# Patient Record
Sex: Male | Born: 1995 | Race: White | Hispanic: No | Marital: Single | Smoking: Never smoker
Health system: Southern US, Community
[De-identification: ages and names within clinical notes are randomized; demographics above are authoritative.]

## PROBLEM LIST (undated history)

## (undated) HISTORY — PX: TONSILLECTOMY: SUR1361

---
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
## 2020-06-22 ENCOUNTER — Encounter: Payer: Self-pay | Admitting: Emergency Medicine

## 2020-06-22 ENCOUNTER — Other Ambulatory Visit: Payer: Self-pay

## 2020-06-22 ENCOUNTER — Ambulatory Visit (INDEPENDENT_AMBULATORY_CARE_PROVIDER_SITE_OTHER): Payer: BC Managed Care – PPO

## 2020-06-22 ENCOUNTER — Ambulatory Visit
Admission: EM | Admit: 2020-06-22 | Discharge: 2020-06-22 | Disposition: A | Discharge status: Home / Self Care | Payer: BC Managed Care – PPO | Attending: Physician Assistant | Admitting: Physician Assistant

## 2020-06-22 DIAGNOSIS — R509 Fever, unspecified: Secondary | ICD-10-CM | POA: Diagnosis not present

## 2020-06-22 DIAGNOSIS — R059 Cough, unspecified: Secondary | ICD-10-CM

## 2020-06-22 DIAGNOSIS — J209 Acute bronchitis, unspecified: Secondary | ICD-10-CM | POA: Diagnosis not present

## 2020-06-22 MED ORDER — DOXYCYCLINE HYCLATE 100 MG PO CAPS: 100.0000 mg | ORAL_CAPSULE | Freq: Two times a day (BID) | ORAL | 0 refills | Status: DC

## 2020-06-22 MED ORDER — GUAIFENESIN-CODEINE 100-10 MG/5ML PO SOLN: 5.0000 mL | Freq: Every evening | ORAL | 0 refills | Status: DC

## 2020-06-22 MED ORDER — ALBUTEROL SULFATE HFA 108 (90 BASE) MCG/ACT IN AERS: 2.0000 | INHALATION_SPRAY | RESPIRATORY_TRACT | 0 refills | Status: DC | PRN

## 2020-06-22 NOTE — ED Triage Notes (Signed)
Patient c/o cough and fever that started 2.5 weeks ago. He reports nausea and vomiting that started 1.5 weeks ago. He then states he started having diarrhea that started this morning. Patient was tested for COVID 2 weeks ago and this was negative.

## 2020-06-22 NOTE — ED Provider Notes (Signed)
[facility]    CSN: [id number] Arrival date & time: 06/22/20  1107      History   Chief Complaint Chief Complaint  Patient presents with  . Cough  . Fever  . Emesis  . Diarrhea    HPI Gotti Alwin is a 24 y.o. male who presents with hx of fever 2 1/2 weeks ago off and on x 2 /1/2  Weeks if 37-39 C. 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  Then four days ago has had a fever of 39 qd and the last time was last night. Has had N/V x 1.5 weeks, and diarrhea started this am. Has had # 3 watery stools. Has not been out of the country or antibiotics in the past month. His cough had bee non productive initially, but became productine 1 week ago with clear mucous. Has had nose congestion and rhinitis x 2.5 weeks as well.. Denies loss of taste or smell. Has been having coughing fits. Denies wheezing. Denies exposure to [employer] being cleaned.   History reviewed. No pertinent past medical history.  There are no problems to display for this patient.   Past Surgical History:  Procedure Laterality Date  . TONSILLECTOMY       Home Medications    Prior to Admission medications   Medication Sig Start Date End Date Taking? Authorizing Provider  albuterol (VENTOLIN HFA) 108 (90 Base) MCG/ACT inhaler Inhale 2 puffs into the lungs every 4 (four) hours as needed (cough attacks). 06/22/20   Rodriguez-Southworth, Nettie Elm, PA-C  doxycycline (VIBRAMYCIN) 100 MG capsule Take 1 capsule (100 mg total) by mouth 2 (two) times daily. 06/22/20   Rodriguez-Southworth, Nettie Elm, PA-C  guaiFENesin-codeine 100-10 MG/5ML syrup Take 5 mLs by mouth at bedtime. 06/22/20   Rodriguez-Southworth, Nettie Elm, PA-C    Family History Family History  Problem Relation Age of Onset  . Healthy Mother   . Healthy Father     Social History Social History   Tobacco Use  . Smoking status: Never Smoker  . Smokeless tobacco: Never Used  Vaping Use  .  Vaping Use: Never used  Substance Use Topics  . Alcohol use: Not Currently  . Drug use: Never     Allergies   Penicillins   Review of Systems Review of Systems + for cough, nose congention, diarrhea, fever. The rest of 10 point ROS is neg  Physical Exam Triage Vital Signs ED Triage Vitals  Enc Vitals Group     BP 06/22/20 1211 (!) 145/89     Pulse Rate 06/22/20 1211 (!) 107     Resp 06/22/20 1211 18     Temp 06/22/20 1211 98.6 F (37 C)     Temp Source 06/22/20 1211 Oral     SpO2 06/22/20 1211 99 %     Weight --      Height 06/22/20 1209 5\' 8"  (1.727 m)     Head Circumference --      Peak Flow --      Pain Score 06/22/20 1209 0     Pain Loc --      Pain Edu? --      Excl. in GC? --    No data found.  Updated Vital Signs BP (!) 145/89 (BP Location: Left Arm)   Pulse (!) 107   Temp 98.6 F (37 C) (Oral)   Resp 18   Ht 5\' 8"  (1.727 m)  SpO2 99%   BMI 27.37 kg/m   Visual Acuity Right Eye Distance:   Left Eye Distance:   Bilateral Distance:    Right Eye Near:   Left Eye Near:    Bilateral Near:     Physical Exam Physical Exam Vitals signs and nursing note reviewed.  Constitutional:      General: he is not in acute distress. Has episodes of cough when he talks     Appearance: Normal appearance. he is not ill-appearing, toxic-appearing or diaphoretic.  HENT:     Head: Normocephalic.     Right Ear: Tympanic membrane, ear canal and external ear normal.     Left Ear: Tympanic membrane, ear canal and external ear normal.     Nose: Nose normal.     Mouth/Throat:     Mouth: Mucous membranes are moist.  Eyes:     General: No scleral icterus.       Right eye: No discharge.        Left eye: No discharge.     Conjunctiva/sclera: Conjunctivae normal.  Neck:     Musculoskeletal: Neck supple. No neck rigidity.  Cardiovascular:     Rate and Rhythm: Normal rate and regular rhythm.     Heart sounds: No murmur.  Pulmonary:     Effort: Pulmonary effort is  normal.     Breath sounds: Normal breath sounds.  Abdominal:     General: Bowel sounds are normal. There is no distension.     Palpations: Abdomen is soft. There is no mass.     Tenderness: There is no abdominal tenderness. There is no guarding or rebound.     Hernia: No hernia is present.  Musculoskeletal: Normal range of motion.  Lymphadenopathy:     Cervical: No cervical adenopathy.  Skin:    General: Skin is warm and dry.     Coloration: Skin is not jaundiced.     Findings: No rash.  Neurological:     Mental Status: She is alert and oriented to person, place, and time.     Gait: Gait normal.  Psychiatric:        Mood and Affect: Mood normal.        Behavior: Behavior normal.        Thought Content: Thought content normal.        Judgment: Judgment normal.     UC Treatments / Results  Labs (all labs ordered are listed, but only abnormal results are displayed) Labs Reviewed - No data to display  EKG   Radiology DG Chest 2 View  Result Date: 06/22/2020 CLINICAL DATA:  25 year old male with cough and fever for 2.5 weeks. Tested negative for COVID-19 two weeks ago. EXAM: CHEST - 2 VIEW COMPARISON:  None. FINDINGS: Normal lung volumes and mediastinal contours. Visualized tracheal air column is within normal limits. Both lungs appear clear. No pneumothorax or pleural effusion. No osseous abnormality identified. Negative visible bowel gas pattern. IMPRESSION: Negative.  No cardiopulmonary abnormality. Electronically Signed   By: Odessa Fleming M.D.   On: 06/22/2020 12:32    Procedures Procedures (including critical care time)  Medications Ordered in UC Medications - No data to display  Initial Impression / Assessment and Plan / UC Course  I have reviewed the triage vital signs and the nursing notes. I suspect he may have Mycoplasma bronchitis. I placed him on Doxy, Albuterol inhaler to help with bronchospasms, and Robitusin AC qhs for cough.  Pertinent  imaging results that were  available  during my care of the patient were reviewed by me and considered in my medical decision making (see chart for details).   Final Clinical Impressions(s) / UC Diagnoses   Final diagnoses:  Acute bronchitis, unspecified organism     Discharge Instructions     You may have Mycoplasma bronchitis and sinusitis.  If your fever persists in 72 hours, you need to be rechecked.     ED Prescriptions    Medication Sig Dispense Auth. Provider   doxycycline (VIBRAMYCIN) 100 MG capsule Take 1 capsule (100 mg total) by mouth 2 (two) times daily. 20 capsule Rodriguez-Southworth, Providencia Hottenstein, PA-C   guaiFENesin-codeine 100-10 MG/5ML syrup Take 5 mLs by mouth at bedtime. 118 mL Rodriguez-Southworth, Wally Shevchenko, PA-C   albuterol (VENTOLIN HFA) 108 (90 Base) MCG/ACT inhaler Inhale 2 puffs into the lungs every 4 (four) hours as needed (cough attacks). 18 g Rodriguez-Southworth, Nettie Elm, PA-C     PDMP not reviewed this encounter.   Garey Ham, PA-C 06/22/20 1447

## 2020-06-22 NOTE — Discharge Instructions (Addendum)
You may have Mycoplasma bronchitis and sinusitis.  If your fever persists in 72 hours, you need to be rechecked.

## 2020-09-06 ENCOUNTER — Ambulatory Visit
Admission: EM | Admit: 2020-09-06 | Discharge: 2020-09-06 | Disposition: A | Discharge status: Home / Self Care | Payer: BC Managed Care – PPO | Attending: Sports Medicine | Admitting: Sports Medicine

## 2020-09-06 ENCOUNTER — Other Ambulatory Visit: Payer: Self-pay

## 2020-09-06 DIAGNOSIS — J029 Acute pharyngitis, unspecified: Secondary | ICD-10-CM

## 2020-09-06 DIAGNOSIS — R059 Cough, unspecified: Secondary | ICD-10-CM

## 2020-09-06 DIAGNOSIS — H6501 Acute serous otitis media, right ear: Secondary | ICD-10-CM

## 2020-09-06 MED ORDER — AZITHROMYCIN 250 MG PO TABS: 250.0000 mg | ORAL_TABLET | Freq: Every day | ORAL | 0 refills | Status: DC

## 2020-09-06 NOTE — ED Provider Notes (Addendum)
[facility]    CSN: [id number] Arrival date & time: 09/06/20  1032      History   Chief Complaint Chief Complaint  Patient presents with  . Otalgia    HPI Maddox Hlavaty is a 25 y.o. male.   Pleasant 25 year old male who presents for evaluation of right ear pain.  Started last night.  He is also had a little vertigo with his symptoms.  No fever shakes chills.  No nausea vomiting diarrhea.  He has had COVID exposure.  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  Mild sore throat mild cough noted.  No chest pain or shortness of breath.  He has had his COVID-vaccine x2 but no booster.  He has had his influenza vaccine as well.  No fever shakes chills nausea vomiting or diarrhea.  No abdominal pain or urinary symptoms.  He has not had any history of recurring strep throat.  No red flag signs or symptoms offered on history.     History reviewed. No pertinent past medical history.  There are no problems to display for this patient.   Past Surgical History:  Procedure Laterality Date  . TONSILLECTOMY         Home Medications    Prior to Admission medications   Medication Sig Start Date End Date Taking? Authorizing Provider  azithromycin (ZITHROMAX) 250 MG tablet Take 1 tablet (250 mg total) by mouth daily. Take first 2 tablets together, then 1 every day until finished. 09/06/20  Yes Delton See, MD  albuterol (VENTOLIN HFA) 108 (90 Base) MCG/ACT inhaler Inhale 2 puffs into the lungs every 4 (four) hours as needed (cough attacks). 06/22/20   Rodriguez-Southworth, Nettie Elm, PA-C  doxycycline (VIBRAMYCIN) 100 MG capsule Take 1 capsule (100 mg total) by mouth 2 (two) times daily. 06/22/20   Rodriguez-Southworth, Nettie Elm, PA-C  guaiFENesin-codeine 100-10 MG/5ML syrup Take 5 mLs by mouth at bedtime. 06/22/20   Rodriguez-Southworth, Nettie Elm, PA-C    Family History Family History  Problem Relation Age of Onset  . Healthy Mother   . Healthy Father     Social  History Social History   Tobacco Use  . Smoking status: Never Smoker  . Smokeless tobacco: Never Used  Vaping Use  . Vaping Use: Never used  Substance Use Topics  . Alcohol use: Not Currently  . Drug use: Never     Allergies   Penicillins   Review of Systems Review of Systems  Constitutional: Negative for activity change, appetite change, chills, fatigue and fever.  HENT: Positive for ear pain and sore throat. Negative for congestion, ear discharge, postnasal drip, rhinorrhea, sinus pressure and sinus pain.   Respiratory: Positive for cough. Negative for apnea, chest tightness, shortness of breath, wheezing and stridor.   Cardiovascular: Negative for chest pain and palpitations.  Gastrointestinal: Negative for abdominal pain.  Genitourinary: Negative for dysuria.  Musculoskeletal: Negative for myalgias.  Skin: Negative for color change, pallor, rash and wound.  Neurological: Positive for dizziness. Negative for tremors, syncope, weakness, light-headedness, numbness and headaches.       Mild vertigo  All other systems reviewed and are negative.    Physical Exam Triage Vital Signs ED Triage Vitals  Enc Vitals Group     BP 09/06/20 1138 138/86     Pulse Rate 09/06/20 1138 60     Resp 09/06/20 1138 19     Temp 09/06/20 1138 98.5 F (36.9 C)     Temp src --  SpO2 09/06/20 1138 100 %     Weight --      Height --      Head Circumference --      Peak Flow --      Pain Score 09/06/20 1137 0     Pain Loc --      Pain Edu? --      Excl. in GC? --    No data found.  Updated Vital Signs BP 138/86   Pulse 60   Temp 98.5 F (36.9 C)   Resp 19   SpO2 100%   Visual Acuity Right Eye Distance:   Left Eye Distance:   Bilateral Distance:    Right Eye Near:   Left Eye Near:    Bilateral Near:     Physical Exam Vitals and nursing note reviewed.  Constitutional:      General: He is not in acute distress.    Appearance: Normal appearance. He is not  ill-appearing, toxic-appearing or diaphoretic.  HENT:     Head: Normocephalic and atraumatic.     Right Ear: Tympanic membrane is erythematous and bulging. Tympanic membrane is not perforated or retracted.     Left Ear: Tympanic membrane normal.     Nose: Nose normal. No congestion or rhinorrhea.     Mouth/Throat:     Mouth: Mucous membranes are dry.     Pharynx: Posterior oropharyngeal erythema present. No oropharyngeal exudate.  Eyes:     Extraocular Movements: Extraocular movements intact.     Conjunctiva/sclera: Conjunctivae normal.     Pupils: Pupils are equal, round, and reactive to light.  Cardiovascular:     Rate and Rhythm: Normal rate and regular rhythm.     Pulses: Normal pulses.     Heart sounds: Normal heart sounds. No murmur heard. No friction rub. No gallop.   Pulmonary:     Effort: Pulmonary effort is normal. No respiratory distress.     Breath sounds: Normal breath sounds. No stridor. No wheezing, rhonchi or rales.  Musculoskeletal:     Cervical back: Normal range of motion and neck supple. No rigidity or tenderness.  Lymphadenopathy:     Cervical: Cervical adenopathy present.  Skin:    General: Skin is warm and dry.     Capillary Refill: Capillary refill takes less than 2 seconds.  Neurological:     General: No focal deficit present.     Mental Status: He is alert and oriented to person, place, and time.      UC Treatments / Results  Labs (all labs ordered are listed, but only abnormal results are displayed) Labs Reviewed - No data to display  EKG   Radiology No results found.  Procedures Procedures (including critical care time)  Medications Ordered in UC Medications - No data to display  Initial Impression / Assessment and Plan / UC Course  I have reviewed the triage vital signs and the nursing notes.  Pertinent labs & imaging results that were available during my care of the patient were reviewed by me and considered in my medical decision  making (see chart for details).  Clinical impression: 25 year old with 2-day history of right ear pain and some mild vertigo.  Exam is consistent with a early otitis media.  Patient has a penicillin allergy and will treat accordingly.  Remainder exam is reassuring as is his vitals.  Treatment plan: 1.  The findings and treatment plan were discussed in detail with the patient.  Patient was in agreement. 2.  Given his penicillin allergy we will treat him with azithromycin.  This was sent to his pharmacy. 3.  He has had COVID exposure however his exam is reassuring.  I did not feel compelled to test him for COVID today.  Certainly if his symptoms were to worsen then he should get a COVID test.  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 4.  Just supportive care, plenty of fluids, plenty of rest.  Tylenol or Motrin for fever or discomfort. 5.  He does not work until tomorrow and I do not feel the need to keep him out of work.  No work note was provided. 6.  Educational handout on otitis media was provided. 7.  Follow-up here as needed.    Final Clinical Impressions(s) / UC Diagnoses   Final diagnoses:  Non-recurrent acute serous otitis media of right ear  Cough  Acute sore throat     Discharge Instructions     Given his penicillin allergy we will treat him with azithromycin.  This was sent to his pharmacy. He has had COVID exposure however his exam is reassuring.  I did not feel compelled to test him for COVID today.  Certainly if his symptoms were to worsen then he should get a COVID test.  He works in the emergency room in [place] city as a Curator so he can obtain the test there. Just supportive care, plenty of fluids, plenty of rest.  Tylenol or Motrin for fever or discomfort. 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  No work note was provided. Educational handout on otitis media was  provided. Follow-up here as needed.    ED Prescriptions    Medication Sig Dispense Auth. Provider   azithromycin (ZITHROMAX) 250 MG tablet Take 1 tablet (250 mg total) by mouth daily. Take first 2 tablets together, then 1 every day until finished. 6 tablet Delton See, MD     PDMP not reviewed this encounter.   Delton See, MD 09/06/20 1257    Delton See, MD 09/06/20 1327

## 2020-09-06 NOTE — ED Triage Notes (Signed)
Pt reports he is having heaviness in his right ear. Reports vertigo that started this morning that continues. Patient also had a bloody nose this morning. Reports the nose bleed did not last long. Pt denies other symptoms.

## 2020-09-06 NOTE — Discharge Instructions (Addendum)
Given his penicillin allergy we will treat him with azithromycin.  This was sent to his pharmacy. He has had COVID exposure however his exam is reassuring.  I did not feel compelled to test him for COVID today.  Certainly if his symptoms were to worsen then he should get a COVID test.  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 Just supportive care, plenty of fluids, plenty of rest.  Tylenol or Motrin for fever or discomfort. 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  No work note was provided. Educational handout on otitis media was provided. Follow-up here as needed.

## 2021-07-20 ENCOUNTER — Other Ambulatory Visit: Payer: Self-pay

## 2021-07-20 ENCOUNTER — Ambulatory Visit
Admission: EM | Admit: 2021-07-20 | Discharge: 2021-07-20 | Disposition: A | Discharge status: Home / Self Care | Payer: BC Managed Care – PPO | Attending: Physician Assistant | Admitting: Physician Assistant

## 2021-07-20 DIAGNOSIS — R229 Localized swelling, mass and lump, unspecified: Secondary | ICD-10-CM | POA: Diagnosis not present

## 2021-07-20 MED ORDER — CEPHALEXIN 500 MG PO CAPS: 500.0000 mg | ORAL_CAPSULE | Freq: Two times a day (BID) | ORAL | 0 refills | Status: AC

## 2021-07-20 NOTE — Discharge Instructions (Addendum)
-  Keflex: 1 tablet twice a day for 10 days I have no idea noticed tomorrow if COVID got and from 8-12 Boykin Nearing from 8-4 right Hegler from 8-4 but that he is but nobody also for 12 Sunday has 1 niece Venancio Poisson 84 and Christiane Ha Cuthriell from 10-2 nt after 3-4 days of antibiotics, return to clinic for reevaluation.

## 2021-07-20 NOTE — ED Provider Notes (Signed)
[facility]    CSN: [id number] Arrival date & time: 07/20/21  1823      History   Chief Complaint Chief Complaint  Patient presents with   Tailbone Pain    HPI Clayton Boyle is a 25 y.o. male.   Patient is a 25 year old male who presents with chief complaint of pain to the top of the gluteal cleft that started last night.  Patient denies any falls or trauma to the area.  Patient denies any similar type pain in the past.  Patient denies any known insect bites or anything like that he has not done any treatment for it.  Patient reports allergies to penicillins.   History reviewed. No pertinent past medical history.  There are no problems to display for this patient.   Past Surgical History:  Procedure Laterality Date   TONSILLECTOMY         Home Medications    Prior to Admission medications   Medication Sig Start Date End Date Taking? Authorizing Provider  cephALEXin (KEFLEX) 500 MG capsule Take 1 capsule (500 mg total) by mouth 2 (two) times daily for 10 days. 07/20/21 07/30/21 Yes Candis Schatz, PA-C    Family History Family History  Problem Relation Age of Onset   Healthy Mother    Healthy Father     Social History Social History   Tobacco Use   Smoking status: Never   Smokeless tobacco: Never  Vaping Use   Vaping Use: Never used  Substance Use Topics   Alcohol use: Not Currently   Drug use: Never     Allergies   Penicillins   Review of Systems Review of Systems as noted above in HPI.  Other systems reviewed and found to be negative   Physical Exam Triage Vital Signs ED Triage Vitals  Enc Vitals Group     BP 07/20/21 1853 127/77     Pulse Rate 07/20/21 1853 74     Resp 07/20/21 1853 18     Temp 07/20/21 1853 98.8 F (37.1 C)     Temp Source 07/20/21 1853 Oral     SpO2 07/20/21 1853 98 %     Weight 07/20/21 1852 210 lb (95.3 kg)     Height 07/20/21 1852 5\' 8"  (1.727 m)     Head Circumference --      Peak Flow --       Pain Score 07/20/21 1852 3     Pain Loc --      Pain Edu? --      Excl. in GC? --    No data found.  Updated Vital Signs BP 127/77 (BP Location: Left Arm)   Pulse 74   Temp 98.8 F (37.1 C) (Oral)   Resp 18   Ht 5\' 8"  (1.727 m)   Wt 210 lb (95.3 kg)   SpO2 98%   BMI 31.93 kg/m    Physical Exam Constitutional:      Appearance: Normal appearance.  HENT:     Head: Normocephalic and atraumatic.  Musculoskeletal:        General: Normal range of motion.     Cervical back: Normal range of motion.  Skin:      Neurological:     Mental Status: He is alert.     UC Treatments / Results  Labs (all labs ordered are listed, but only abnormal results are displayed) Labs Reviewed - No data to display  EKG   Radiology No results found.  Procedures Procedures (  including critical care time)  Medications Ordered in UC Medications - No data to display  Initial Impression / Assessment and Plan / UC Course  I have reviewed the triage vital signs and the nursing notes.  Pertinent labs & imaging results that were available during my care of the patient were reviewed by me and considered in my medical decision making (see chart for details).    Patient with painful nodule at the top of the gluteal cleft.  Possible early abscess versus cyst.  No erythema, no induration, no fever.  It is tender to palpation.  We will go ahead and put him on an antibiotic advised him if no improvement in 3 to 4 days to return to clinic for reevaluation. Final Clinical Impressions(s) / UC Diagnoses   Final diagnoses:  Skin nodule     Discharge Instructions      -Keflex: 1 tablet twice a day for 10 days -Can use ibuprofen and Tylenol for pain -Can use warm bath with Epsom salt for pain relief as well -If no improvement after 3-4 days of antibiotics, return to clinic for reevaluation.     ED Prescriptions     Medication Sig Dispense Auth. Provider   cephALEXin (KEFLEX) 500 MG capsule  Take 1 capsule (500 mg total) by mouth 2 (two) times daily for 10 days. 20 capsule Candis Schatz, PA-C      PDMP not reviewed this encounter.   Candis Schatz, PA-C 07/20/21 1954

## 2021-07-20 NOTE — ED Triage Notes (Signed)
Pt here with C/O tailbone pain, no known fall or anything, thinks it may be a cyst, started last night.

## 2022-02-28 IMAGING — CR DG CHEST 2V
2 series · 2 of 2 positions shown · non-contrast
Comparison: None.

CLINICAL DATA: 23-year-old male with cough and fever for 2.5 weeks.
Tested negative for 1CVHO-BU two weeks ago.

EXAM:
CHEST - 2 VIEW

[chest pa]
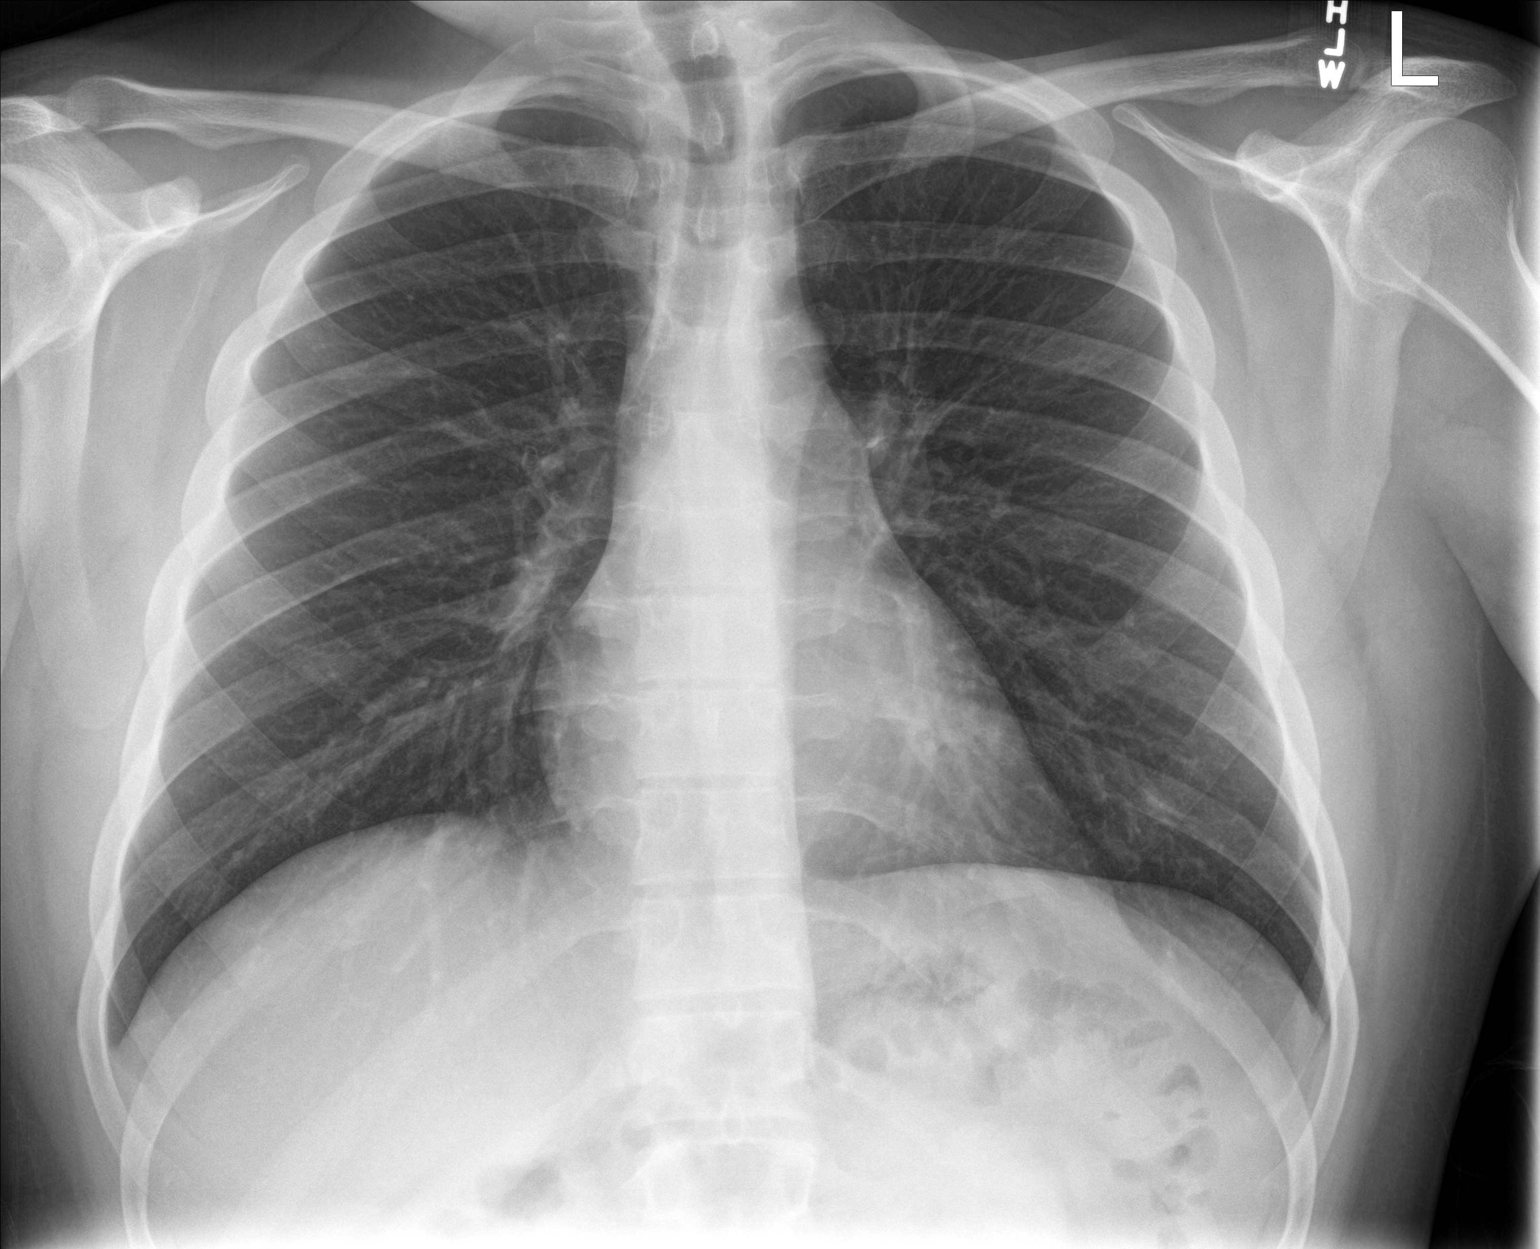

[chest lat]
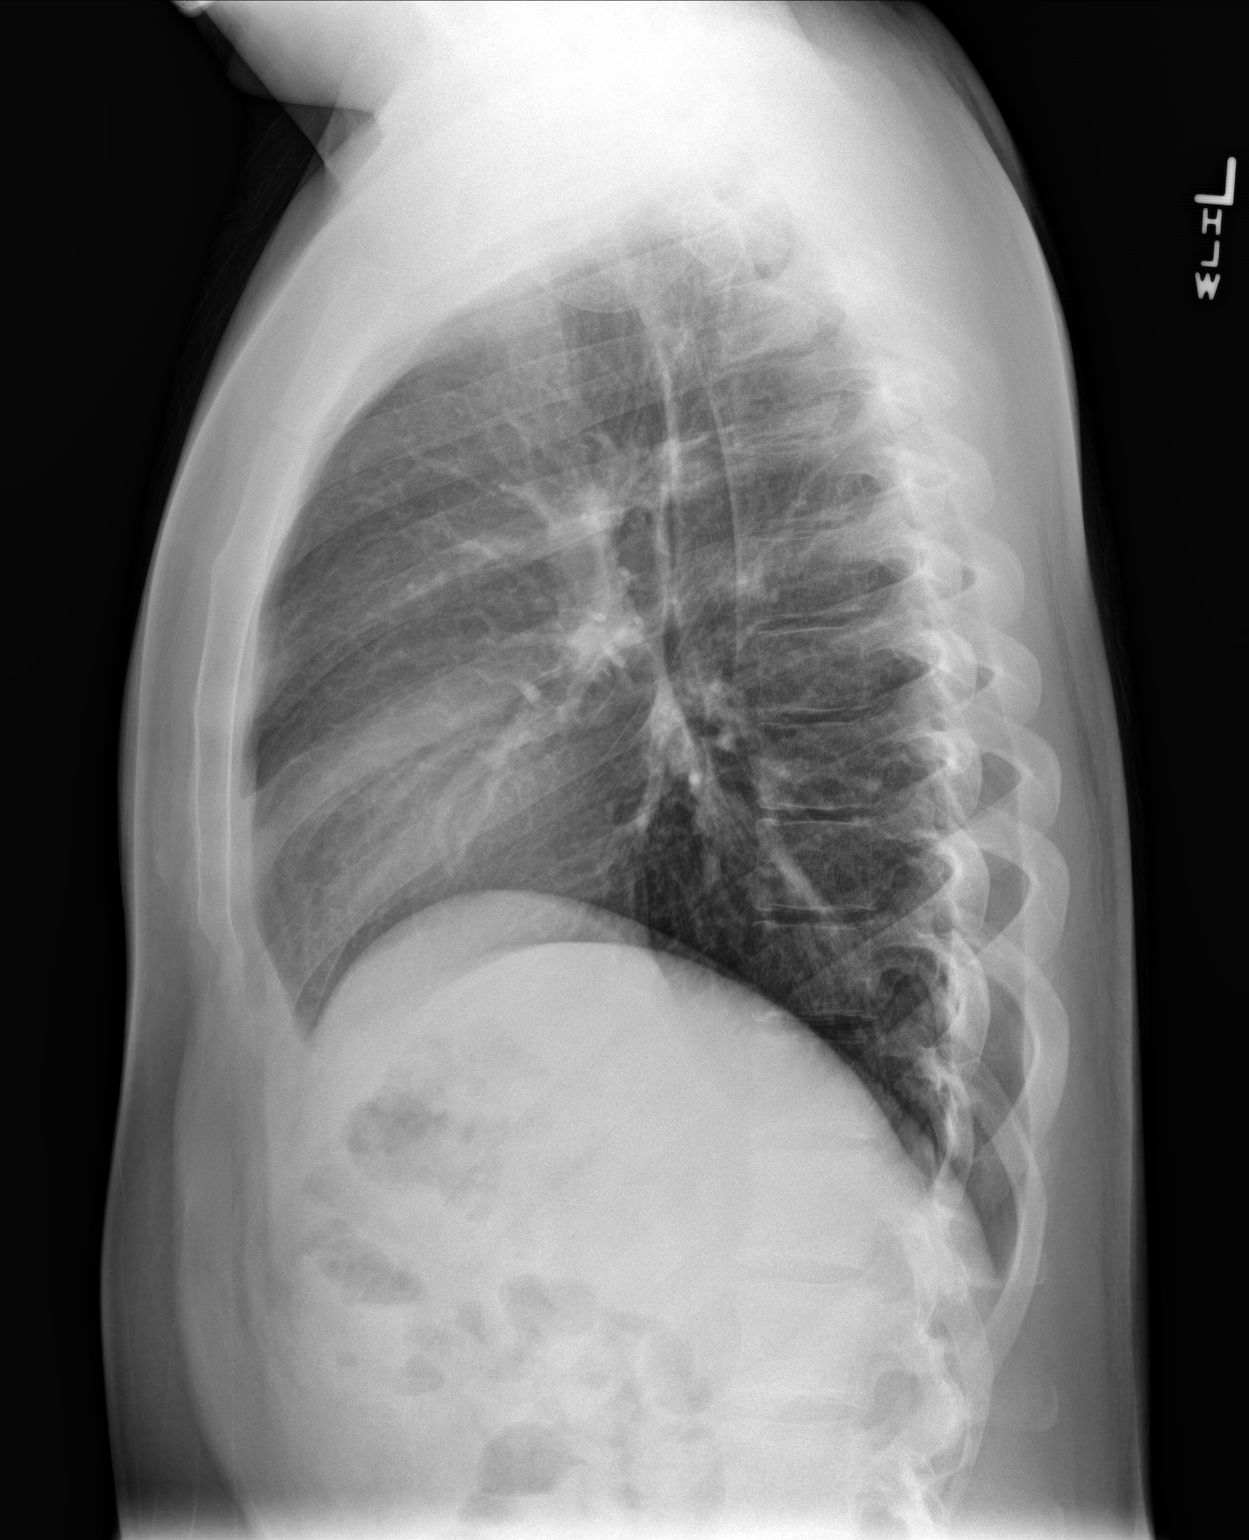

[2 of 2 positions shown; findings below may reference images not displayed]

FINDINGS: Normal lung volumes and mediastinal contours. Visualized tracheal
air column is within normal limits. Both lungs appear clear. No
pneumothorax or pleural effusion.

No osseous abnormality identified. Negative visible bowel gas
pattern.
IMPRESSION: Negative.  No cardiopulmonary abnormality.

## 2022-12-03 ENCOUNTER — Ambulatory Visit
Admission: EM | Admit: 2022-12-03 | Discharge: 2022-12-03 | Disposition: A | Discharge status: Home / Self Care | Payer: Commercial Managed Care - PPO | Attending: Physician Assistant | Admitting: Physician Assistant

## 2022-12-03 DIAGNOSIS — K611 Rectal abscess: Secondary | ICD-10-CM | POA: Diagnosis not present

## 2022-12-03 MED ORDER — DOXYCYCLINE HYCLATE 100 MG PO CAPS: 100.0000 mg | ORAL_CAPSULE | Freq: Two times a day (BID) | ORAL | 0 refills | Status: DC

## 2022-12-03 NOTE — ED Provider Notes (Signed)
[facility]    CSN: [id number] Arrival date & time: 12/03/22  0806      History   Chief Complaint Chief Complaint  Patient presents with   Cyst    HPI Clayton Boyle is a 27 y.o. male.   27 year old male presents with rectal pain.  Patient indicates for the past year he has been having intermittent episodes of perirectal abscess that has been occurring every couple months.  Patient indicates over the past several days he has been having rectal pain and discomfort where he usually has the abscesses occur.  He indicates that he has to use a belt which tends to aggravate the area and he also does quite a bit of sitting in his job this also aggravates the area.  He indicates that he is having progressive pain and discomfort when he tries to sit.  He indicates that the area is tender, he believes that it is swollen.  He indicates usually it does not drain he is usually given an antibiotic and it tends to resolve.  Patient expresses the desire to go to a surgeon to have the area evaluated and possibly resolved.  He is without fever or chills.  He has been taking OTC ibuprofen with minimal relief.     History reviewed. No pertinent past medical history.  There are no problems to display for this patient.   Past Surgical History:  Procedure Laterality Date   TONSILLECTOMY         Home Medications    Prior to Admission medications   Medication Sig Start Date End Date Taking? Authorizing Provider  doxycycline (VIBRAMYCIN) 100 MG capsule Take 1 capsule (100 mg total) by mouth 2 (two) times daily. 12/03/22  Yes Ellsworth Lennox, PA-C    Family History Family History  Problem Relation Age of Onset   Healthy Mother    Healthy Father     Social History Social History   Tobacco Use   Smoking status: Never   Smokeless tobacco: Never  Vaping Use   Vaping Use: Never used  Substance Use Topics   Alcohol use: Not Currently   Drug use: Never     Allergies    Penicillins   Review of Systems Review of Systems  Gastrointestinal:  Positive for rectal pain (perirectal abscess).     Physical Exam Triage Vital Signs ED Triage Vitals  Enc Vitals Group     BP 12/03/22 0822 131/75     Pulse Rate 12/03/22 0822 98     Resp 12/03/22 0822 17     Temp 12/03/22 0822 98.8 F (37.1 C)     Temp Source 12/03/22 0822 Oral     SpO2 12/03/22 0822 97 %     Weight --      Height --      Head Circumference --      Peak Flow --      Pain Score 12/03/22 0820 0     Pain Loc --      Pain Edu? --      Excl. in GC? --    No data found.  Updated Vital Signs BP 131/75 (BP Location: Right Arm)   Pulse 98   Temp 98.8 F (37.1 C) (Oral)   Resp 17   SpO2 97%   Visual Acuity Right Eye Distance:   Left Eye Distance:   Bilateral Distance:    Right Eye Near:   Left Eye Near:    Bilateral Near:  Physical Exam Constitutional:      Appearance: Normal appearance.  Genitourinary:      Comments: Rectum: There is a 2 x 2-1/2 cm perirectal abscess that is present, material is solid without fluctuance, mild redness is present, there is pain with palpation, no drainage present. Neurological:     Mental Status: He is alert.      UC Treatments / Results  Labs (all labs ordered are listed, but only abnormal results are displayed) Labs Reviewed - No data to display  EKG   Radiology No results found.  Procedures Procedures (including critical care time)  Medications Ordered in UC Medications - No data to display  Initial Impression / Assessment and Plan / UC Course  I have reviewed the triage vital signs and the nursing notes.  Pertinent labs & imaging results that were available during my care of the patient were reviewed by me and considered in my medical decision making (see chart for details).    Plan: The diagnosis will be treated with the following: 1.  Perirectal abscess: A.  Doxycycline 100 mg every 12 hours to treat  infection. B.  Ibuprofen 600 mg every 6 hours with food to help decrease pain and discomfort. C.  Epsom salt soaks, 1 tablespoon lukewarm water, soak 15 minutes, 3-4 times throughout the evening to help reduce pain and discomfort. 2.  Internal referral to [facility] may been for evaluation and treatment modification of the abscess. 3.  Advised follow-up PCP return to urgent care as needed. Final Clinical Impressions(s) / UC Diagnoses   Final diagnoses:  Perirectal abscess     Discharge Instructions      Advised take ibuprofen 600 mg every 6 hours with food on a regular basis to decrease pain and discomfort. Advised to use Epsom salt soaks, 1 tablespoon Epsom salts, lukewarm water, soak for 10 to 15 minutes, 3-4 times throughout the day to help reduce pain and swelling. Advised take the doxycycline 100 mg every 12 hours on a regular basis to help decrease infection and discomfort.  [facility] [place] [facility], their office should be calling you within the next 48-72 hours to arrange an appointment to be seen and evaluated for the abscess.  Advised follow-up PCP return to urgent care as needed.    ED Prescriptions     Medication Sig Dispense Auth. Provider   doxycycline (VIBRAMYCIN) 100 MG capsule Take 1 capsule (100 mg total) by mouth 2 (two) times daily. 20 capsule Ellsworth Lennox, PA-C      PDMP not reviewed this encounter.   Ellsworth Lennox, PA-C 12/03/22 [phone]

## 2022-12-03 NOTE — ED Triage Notes (Signed)
Pt c/o cyst on tail bone pt states it has been recurring over the past year and would like it to be looked at.   Pt has previously used antibiotics for it.

## 2022-12-03 NOTE — Discharge Instructions (Addendum)
Advised take ibuprofen 600 mg every 6 hours with food on a regular basis to decrease pain and discomfort. Advised to use Epsom salt soaks, 1 tablespoon Epsom salts, lukewarm water, soak for 10 to 15 minutes, 3-4 times throughout the day to help reduce pain and swelling. Advised take the doxycycline 100 mg every 12 hours on a regular basis to help decrease infection and discomfort.  [facility] been [facility] [place] [facility], their office should be calling you within the next 48-72 hours to arrange an appointment to be seen and evaluated for the abscess.  Advised follow-up PCP return to urgent care as needed.

## 2022-12-04 ENCOUNTER — Encounter: Payer: Self-pay | Admitting: Surgery

## 2022-12-04 ENCOUNTER — Ambulatory Visit (INDEPENDENT_AMBULATORY_CARE_PROVIDER_SITE_OTHER): Payer: Commercial Managed Care - PPO | Admitting: Surgery

## 2022-12-04 VITALS — BP 137/88 | HR 94 | Temp 98.7°F | Ht 68.0 in | Wt 220.4 lb

## 2022-12-04 DIAGNOSIS — L0591 Pilonidal cyst without abscess: Secondary | ICD-10-CM

## 2022-12-04 NOTE — Progress Notes (Signed)
12/04/2022  Reason for Visit:  Infected pilonidal cyst  History of Present Illness: Clayton Boyle is a 27 y.o. male presenting for evaluation of an infected pilonidal cyst.  He reports he has had this area in the upper portion of the gluteal cleft that has been flaring back and forth over the past 2 years.  The last episode was a few months ago but also lasted longer than the priors.  Most recently, he presented to Urgent Care yesterday with complaints of a few days of worsening pain and swelling in the same area.  Denies any drainage.  Typically on prior episodes he's received antibiotic and has helped.  On the last episode he got Ancef and this appears to have not helped as much as the soreness lasted much longer.  Inetta Fermo he was started on Doxycycline and he started the course last night.  Denies any fevers or chills.  He has not had any procedures in the area and never required an I&D in the past.  Past Medical History: --Pilonidal cyst   Past Surgical History: Past Surgical History:  Procedure Laterality Date   TONSILLECTOMY      Home Medications: Prior to Admission medications   Medication Sig Start Date End Date Taking? Authorizing Provider  doxycycline (VIBRAMYCIN) 100 MG capsule Take 1 capsule (100 mg total) by mouth 2 (two) times daily. 12/03/22  Yes Ellsworth Lennox, PA-C    Allergies: Allergies  Allergen Reactions   Penicillins Hives    Social History:  reports that he has never smoked. He has never used smokeless tobacco. He reports that he does not currently use alcohol. He reports that he does not use drugs.   Family History: Family History  Problem Relation Age of Onset   Healthy Mother    Healthy Father     Review of Systems: Review of Systems  Constitutional:  Negative for chills and fever.  Respiratory:  Negative for shortness of breath.   Cardiovascular:  Negative for chest pain.  Gastrointestinal:  Negative for abdominal pain, nausea and vomiting.  Skin:         Swelling/redness at top of gluteal cleft    Physical Exam BP 137/88   Pulse 94   Temp 98.7 F (37.1 C) (Oral)   Ht  (1.727 m)   Wt 220 lb 6.4 oz (100 kg)   SpO2 99%   BMI 33.51 kg/m  CONSTITUTIONAL: No acute distress HEENT:  Normocephalic, atraumatic, extraocular motion intact. RESPIRATORY:  Normal respiratory effort without pathologic use of accessory muscles. CARDIOVASCULAR: Regular rhythm and rate. MUSCULOSKELETAL:  Normal muscle strength and tone in all four extremities.  No peripheral edema or cyanosis. SKIN: The superior portion of the gluteal cleft has an area of inflammation/induration with erythema measuring about 3 x 2.5 cm, without fluctuance.  Inferior to this, he has several pilonidal sinus/pits.  No active draiange.  NEUROLOGIC:  Motor and sensation is grossly normal.  Cranial nerves are grossly intact. PSYCH:  Alert and oriented to person, place and time. Affect is normal.  Laboratory Analysis: No results found for this or any previous visit (from the past 24 hour(s)).  Imaging: No results found.  Assessment and Plan: This is a 27 y.o. male with an infected pilonidal cyst  --Discussed with the patient that he has an infected pilonidal cyst.  Currently the area has induration and erythema but no real fluctuance.  He just started Doxycycline.  I think for now it is ok to continue the new antibiotic  course.  If however the area is not improving in two days (4/19), I instructed him to call us back so we can see him and potentially do an I&D.  Discussed with him that down the line we would recommend excision of the pilonidal cyst to help prevent further episodes.  However, this episodes needs to heal and infection subside prior to any formal excision. --Patient understands this plan and all of his questions have been answered.  I spent 30 minutes dedicated to the care of this patient on the date of this encounter to include pre-visit review of records,  face-to-face time with the patient discussing diagnosis and management, and any post-visit coordination of care.   Howie Ill, MD [facility]

## 2022-12-04 NOTE — Patient Instructions (Addendum)
Complete the Doxycycline and use Ibuprofen and Tylenol for pain.  If you have any concerns or questions, please feel free to call our office. See follow up appointment below.   If its not better by Friday, please call us.  Pilonidal Cyst  A pilonidal cyst is a fluid-filled sac that forms under the skin near the tailbone, at the top of the crease of the buttocks (pilonidal area). Cysts that are small and not infected may not cause any problems. Cysts that become irritated or infected may grow and fill with pus. An infected cyst is called an abscess. A pilonidal abscess may cause pain and swelling. It may need to be drained or removed. What are the causes? The cause of this condition is not always known. In some cases, it may be caused by a hair that grows into your skin (ingrown hair). What increases the risk? You are more likely to develop this condition if: You are male. You have lots of hair near the crease of the buttocks. You are overweight. You have a dimple near the crease of the buttocks. You wear tight clothing. You do not bathe or shower often. You sit for long periods of time. What are the signs or symptoms? Symptoms of this condition may include pain, swelling, redness, and warmth in the pilonidal area. You may also be able to feel a lump near your tailbone if your cyst is big. If your cyst becomes infected, symptoms may include: Pus or fluid drainage. Fever. Pain, swelling, and redness getting worse. The lump getting bigger. How is this diagnosed? This condition may be diagnosed based on: Your symptoms and medical history. A physical exam. A blood test to check for infection. A test of a pus sample. How is this treated? You may not need any treatment if your cyst does not cause symptoms. If your cyst bothers you or is infected, you may need a procedure to drain or remove the cyst. Depending on the size, location, and severity of your cyst, your health care provider  may: Make an incision in the cyst and drain it (incision anddrainage). Open and drain the cyst, and then stitch the wound so that it stays open while it heals (marsupialization). You will be given instructions about how to care for your open wound while it heals. Remove all or part of the cyst, and then close the wound (cyst removal). You may need to take antibiotics before your procedure. Follow these instructions at home: Medicines Take over-the-counter and prescription medicines only as told by your health care provider. If you were prescribed antibiotics, take them as told by your health care provider. Do not stop taking the antibiotic even if you start to feel better. General instructions Keep the area around the cyst clean and dry. If there is fluid or pus draining from your cyst: Cover the area with a clean bandage (dressing). Wash the area gently with soap and water. Pat the area dry with a clean towel. Do not rub the area because that may cause bleeding. Remove hair from the area around the cyst only if your health care provider tells you to. Do not wear tight pants or sit in one position for long periods at a time. Contact a health care provider if: You have new redness, swelling, or pain. You have a fever. You have severe pain. Summary A pilonidal cyst is a fluid-filled sac that forms under the skin near the tailbone, at the top of the crease of the buttocks (  pilonidal area). Cysts that become irritated or infected may grow and fill with pus. An infected cyst is called an abscess. The cause of this condition is not always known. In some cases, it may be caused by a hair that grows into your skin (ingrown hair). You may not need any treatment if your cyst does not cause symptoms. If your cyst bothers you or is infected, you may need a procedure to drain or remove the cyst. This information is not intended to replace advice given to you by your health care provider. Make sure you  discuss any questions you have with your health care provider. Document Revised: 10/31/2021 Document Reviewed: 10/31/2021 Elsevier Patient Education  2023 [employer].

## 2022-12-09 ENCOUNTER — Encounter: Payer: Self-pay | Admitting: Surgery

## 2022-12-09 ENCOUNTER — Ambulatory Visit (INDEPENDENT_AMBULATORY_CARE_PROVIDER_SITE_OTHER): Payer: Commercial Managed Care - PPO | Admitting: Surgery

## 2022-12-09 VITALS — BP 119/83 | HR 91 | Temp 98.7°F | Ht 69.0 in | Wt 221.6 lb

## 2022-12-09 DIAGNOSIS — L0591 Pilonidal cyst without abscess: Secondary | ICD-10-CM

## 2022-12-09 MED ORDER — OXYCODONE HCL 5 MG PO TABS: 5.0000 mg | ORAL_TABLET | Freq: Four times a day (QID) | ORAL | 0 refills | Status: DC | PRN

## 2022-12-09 MED ORDER — DOXYCYCLINE HYCLATE 100 MG PO CAPS: 100.0000 mg | ORAL_CAPSULE | Freq: Two times a day (BID) | ORAL | 0 refills | Status: AC

## 2022-12-09 NOTE — Patient Instructions (Signed)
If you have any concerns or questions, please feel free to call our office. See follow up appointment below.   Incision and Drainage, Care After After incision and drainage, it is common to have: Pain or discomfort around the incision site. Blood, fluid, or pus (drainage) from the incision. Redness and firm skin around the incision site. Follow these instructions at home: Medicines Take over-the-counter and prescription medicines only as told by your health care provider. If you were prescribed antibiotics, take them as told by your provider. Do not stop using the antibiotic even if you start to feel better. Do not apply creams, ointments, or liquids unless you have been told to by your provider. Wound care Follow instructions from your provider about how to take care of your wound. Make sure you: Wash your hands with soap and water for at least 20 seconds before and after you change your bandage (dressing). If soap and water are not available, use hand sanitizer. Change your dressing and any packing as told by your provider. If the dressing is dry or stuck when you try to remove it, moisten or wet it with saline or water. This will help you remove it without harming your skin or tissues. If your wound is packed, leave it in place until your provider tells you to remove it. To remove it, moisten or wet the packing with saline or water. Leave stitches (sutures), skin glue, or tape strips in place. These skin closures may need to stay in place for 2 weeks or longer. If tape strip edges start to loosen and curl up, you may trim the loose edges. Do not remove tape strips completely unless your provider tells you to do that. Check your wound every day for signs of infection. Check for: More redness, swelling, or pain. More fluid or blood. Warmth. Pus or a bad smell. If you were sent home with a drain tube in place, follow instructions from your provider about: How to empty it. How to care for it  at home. Be careful when you get rid of used dressings, wound packing, or drainage. Activity Rest the affected area. Return to your normal activities as told by your provider. Ask your provider what activities are safe for you. General instructions Do not use any products that contain nicotine or tobacco. These products include cigarettes, chewing tobacco, and vaping devices, such as e-cigarettes. These can delay incision healing after surgery. If you need help quitting, ask your provider. Do not take baths, swim, or use a hot tub until your provider approves. Ask your provider if you may take showers. You may only be allowed to take sponge baths. The incision will keep draining. It is normal to have some clear or slightly bloody drainage. The amount of drainage should go down each day. Keep all follow-up visits. Your provider will need to make sure that your incision is healing well and that there are no problems. Your health care provider may give you more instructions. Make sure you know what you can and cannot do Contact a health care provider if: Your cyst or abscess comes back. You have any signs of infection. You notice red streaks that spread away from the incision site. You have a fever or chills. Get help right away if: You have severe pain or bleeding. You become short of breath. You have chest pain. You have signs of a severe infection. You may notice changes in your incision area, such as: Swelling that makes the skin feel  hard. Numbness or tingling. Sudden increase in redness. Your skin color may change from red to purple, and then to dark spots. Blisters, ulcers, or splitting of the skin. These symptoms may be an emergency. Get help right away. Call 911. Do not wait to see if the symptoms will go away. Do not drive yourself to the hospital. This information is not intended to replace advice given to you by your health care provider. Make sure you discuss any questions you  have with your health care provider. Document Revised: 03/25/2022 Document Reviewed: 03/25/2022 Elsevier Patient Education  2023 [employer].

## 2022-12-09 NOTE — Progress Notes (Signed)
  Procedure Date:  12/09/2022  Pre-operative Diagnosis:  Infected pilonidal cyst  Post-operative Diagnosis: Infected pilonidal cyst  Procedure:  Incision and Drainage of infected pilonidal cyst  Surgeon:  Howie Ill, MD  Anesthesia:  3 ml of 1% lidocaine with bicarb  Estimated Blood Loss:  5 ml  Specimens:  Fluid swab for culture  Complications:  None  Indications for Procedure:  This is a 27 y.o. male with diagnosis of an infected pilonidal cyst, requiring drainage procedure.  The risks of bleeding, abscess or infection, injury to surrounding structures, and need for further procedures were all discussed with the patient and was willing to proceed.  Description of Procedure: The patient was correctly identified at bedside.  Appropriate time-outs were performed prior to procedure.  The patient's sacral area was prepped and draped in usual sterile fashion.  Local anesthetic was infused intradermally.  A 1 cm elliptical incision was made over the abscess, revealing purulent fluid.  This fluid was swabbed for culture and sent to micro.  Small Kelly forceps were used to dissect around the abscess tissue to open any remaining pockets of purulent fluid.  After drainage was completed, the cavity was irrigated and cleaned.  The wound was packed with 1/2 inch iodoform gauze and covered with dry gauze and tape.  The patient tolerated the procedure well and all sharps were appropriately disposed of at the end of the case.  --Instructed patient on daily packing dressing changes.  The 4x4 gauze dressing may be changed at least daily and as needed to keep the area clean/dry. --Will extend the patient's Doxycycline course for 7 total more days. --Will prescribe the patient short course of Oxycodone to help with post-procedure pain control. --Follow up in 1 week.  Howie Ill, MD

## 2022-12-11 ENCOUNTER — Ambulatory Visit: Payer: Commercial Managed Care - PPO | Admitting: Surgery

## 2022-12-12 LAB — ANAEROBIC AND AEROBIC CULTURE

## 2022-12-13 ENCOUNTER — Ambulatory Visit: Payer: Commercial Managed Care - PPO | Admitting: Surgery

## 2022-12-14 LAB — ANAEROBIC AND AEROBIC CULTURE

## 2022-12-16 ENCOUNTER — Encounter: Payer: Self-pay | Admitting: Surgery

## 2022-12-16 ENCOUNTER — Ambulatory Visit (INDEPENDENT_AMBULATORY_CARE_PROVIDER_SITE_OTHER): Payer: Commercial Managed Care - PPO | Admitting: Surgery

## 2022-12-16 VITALS — BP 114/77 | HR 88 | Temp 98.0°F | Ht 69.0 in | Wt 219.0 lb

## 2022-12-16 DIAGNOSIS — Z09 Encounter for follow-up examination after completed treatment for conditions other than malignant neoplasm: Secondary | ICD-10-CM

## 2022-12-16 DIAGNOSIS — L0591 Pilonidal cyst without abscess: Secondary | ICD-10-CM

## 2022-12-16 NOTE — Progress Notes (Signed)
12/16/2022  HPI: Clayton Boyle is a 27 y.o. male s/p incision and drainage of infected pilonidal cyst on 12/09/2022.  Patient presents today for follow-up.  Patient reports that he has been doing much better without any significant pain.  He only reports pain when packing the wound.  Denies any fevers, chills.  Still taking his antibiotics as instructed.  Vital signs: BP 114/77   Pulse 88   Temp 98 F (36.7 C)   Ht 5\' 9"  (1.753 m)   Wt 219 lb (99.3 kg)   SpO2 98%   BMI 32.34 kg/m    Physical Exam: Constitutional: No acute distress Skin: Gluteal cleft wound is healing appropriately without any residual induration or tenderness.  The wound bed itself has good granulation tissue.  No further purulence.  Wound dressed with gauze dressing.  Assessment/Plan: This is a 27 y.o. male s/p I&D of pilonidal cyst.  - Patient is healing appropriately and discussed with him that he should continue packing the wound until it is healing more and become shallow enough that he cannot hold any more packing.  At that point he can transition to a dry gauze dressing. -Continue take antibiotic until course completed. - Patient will follow-up in about 3 weeks to check on his progress and see how everything is healing.  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 - Follow-up in 3 weeks.   Howie Ill, MD [facility]

## 2022-12-16 NOTE — Patient Instructions (Addendum)
If you have any concerns or questions, please feel free to call our office. See follow up appointment below.    Pilonidal Cyst Drainage  A pilonidal cyst is a fluid-filled sac that forms under the skin near the tailbone, at the top of the crease of the buttocks (pilonidal area). Incision and drainage is a procedure to open and drain a pilonidal cyst. You may need this procedure if your cyst becomes infected (pilonidal abscess). A pilonidal abscess may cause pain and swelling. There are three types of procedures to drain a pilonidal cyst. The type of procedure you have will depend on the size, location, and severity of your cyst. You may have: Incision and drainage with wound packing. Wound packing involves placing a germ-free packing material (gauze) into the incision. Marsupialization. This involves opening and draining the cyst. The edges of the incision are then stitched (sutured) to the skin around the incision. Incision and drainage without wound packing. The incision is closed and covered with a dressing. Tell a health care provider about: Any allergies you have. All medicines you are taking, including vitamins, herbs, eye drops, creams, and over-the-counter medicines. Any problems you or family members have had with anesthetic medicines. Any bleeding problems you have. Any surgeries you have had. Any medical conditions you have. Whether you are pregnant or may be pregnant. What are the risks? Talk to your health care provider about risks. These may include: Infection. Bleeding. Allergic reactions to medicines. The cyst coming back again (recurrence). What happens before the procedure? When to stop eating and drinking Follow instructions from your health care provider about what you may eat and drink. These may include: 8 hours before your procedure Stop eating most foods. Do not eat meat, fried foods, or fatty foods. Eat only light foods, such as toast or crackers. All liquids  are okay except energy drinks and alcohol. 6 hours before your procedure Stop eating. Drink only clear liquids, such as water, clear fruit juice, black coffee, plain tea, and sports drinks. Do not drink energy drinks or alcohol. 2 hours before your procedure Stop drinking all liquids. You may be allowed to take medicines with small sips of water. If you do not follow your health care provider's instructions, your procedure may be delayed or canceled. Medicines Ask your health care provider about: Changing or stopping your regular medicines. These include any diabetes medicines or blood thinners you take. Taking medicines such as aspirin and ibuprofen. These medicines can thin your blood. Do not take them unless your health care provider tells you to. Taking over-the-counter medicines, vitamins, herbs, and supplements. Surgery safety Ask your health care provider: How your surgery site will be marked. What steps will be taken to help prevent infection. These steps may include: Removing hair at the surgery site. Washing skin with a soap that kills germs. Taking antibiotics. General instructions Do not use any products that contain nicotine or tobacco for at least 4 weeks before the procedure. These products include cigarettes, chewing tobacco, and vaping devices, such as e-cigarettes. If you need help quitting, ask your health care provider. If you will be going home right after the procedure, plan to have a responsible adult: Take you home from the hospital or clinic. You will not be allowed to drive. Care for you for the time you are told. You may need help with wound care and dressing changes. What happens during the procedure? An IV may be inserted into one of your veins. You may be given:  A sedative. This helps you relax. Anesthesia. This will: Numb certain areas of your body. Make you fall asleep for surgery. You will lie down on your stomach. Tape may be used to spread your  buttocks. The area around the cyst will be cleaned. An incision will be made in the cyst. Your surgeon may insert a tube with a light and camera on the end (probe). This is done to see how deep the cyst is. Fluid or pus inside the cyst will be drained. The cyst will be flushed out (irrigated). The rest of the procedure will depend on the type of procedure you are having. For incision and drainage with wound packing: Gauze will be placed into the wound. This keeps the wound open so it can keep draining after surgery. It also helps the wound heal from the inside out. The area will be covered with a dressing. For marsupialization: The edges of the incision will be stitched to your skin. This keeps the wound open while it heals. It also helps deep wounds heal from the inside out. A dressing will be placed over the wound. For incision and drainage without wound packing: Some tissue around the cyst may be removed. The incision may be closed with sutures, skin glue, or adhesive strips. The incision will be covered with a dressing. These procedures may vary among health care providers and hospitals. What happens after the procedure? Your blood pressure, heart rate, breathing rate, and blood oxygen level will be monitored until you leave the hospital or clinic. You will be given medicine for pain as needed. If you were given a sedative during the procedure, it can affect you for several hours. Do not drive or operate machinery until your health care provider says that it is safe. Summary A pilonidal cyst is a fluid-filled sac that forms under the skin near the tailbone, at the top of the crease of the buttocks (pilonidal area). Incision and drainage is a procedure to open and drain the cyst. The type of procedure you have will depend on the size, location, and severity of your cyst. You may need this procedure if your cyst becomes infected (pilonidal abscess). This information is not intended to  replace advice given to you by your health care provider. Make sure you discuss any questions you have with your health care provider. Document Revised: 10/31/2021 Document Reviewed: 10/31/2021 Elsevier Patient Education  2023 [employer].

## 2023-01-08 ENCOUNTER — Encounter: Payer: Self-pay | Admitting: Surgery

## 2023-01-08 ENCOUNTER — Ambulatory Visit (INDEPENDENT_AMBULATORY_CARE_PROVIDER_SITE_OTHER): Payer: Commercial Managed Care - PPO | Admitting: Surgery

## 2023-01-08 VITALS — BP 124/81 | HR 89 | Temp 98.5°F | Ht 69.0 in | Wt 218.2 lb

## 2023-01-08 DIAGNOSIS — L0591 Pilonidal cyst without abscess: Secondary | ICD-10-CM

## 2023-01-08 DIAGNOSIS — Z09 Encounter for follow-up examination after completed treatment for conditions other than malignant neoplasm: Secondary | ICD-10-CM

## 2023-01-08 NOTE — Progress Notes (Signed)
01/08/2023  HPI: Clayton Boyle is a 27 y.o. male s/p I&D of pilonidal abscess on 12/09/22.  He presents today for follow up.  He reports the wound is healed now and is no longer needing any packing.  Denies any worsening pain.   Vital signs: BP 124/81   Pulse 89   Temp 98.5 F (36.9 C) (Oral)   Ht 5\' 9"  (1.753 m)   Wt 218 lb 3.2 oz (99 kg)   SpO2 98%   BMI 32.22 kg/m    Physical Exam: Constitutional: No acute distress Skin:  I&D site at the superior gluteal cleft is well healed now, without any open wound.  There is a 1.5 cm area of abrasion in the upper medial left buttocks from the tape he used for dressing change.  Three sinus pits again seen in the mid gluteal cleft  Assessment/Plan: This is a 27 y.o. male s/p I&D of pilonidal abscess  --The patient's wound is fully healed, without any residual fluctuance or tenderness.  No further dressings are needed. --May apply neosporin/bandaid to the skin abrasion --He is interested in proceeding with formal excision of the pilonidal cyst in the future.  He would like to wait until the end of July.  I think that's reasonable and he will follow up with me in two months to discuss surgery and scheduling. --In the meantime, he knows to call us if any worsening issues.   Clayton Ill, MD [facility]

## 2023-01-08 NOTE — Patient Instructions (Addendum)
If you have any concerns or questions, please feel free to call our office. See follow up appointment.   Pilonidal Cyst  A pilonidal cyst is a fluid-filled sac that forms under the skin near the tailbone, at the top of the crease of the buttocks (pilonidal area). Cysts that are small and not infected may not cause any problems. Cysts that become irritated or infected may grow and fill with pus. An infected cyst is called an abscess. A pilonidal abscess may cause pain and swelling. It may need to be drained or removed. What are the causes? The cause of this condition is not always known. In some cases, it may be caused by a hair that grows into your skin (ingrown hair). What increases the risk? You are more likely to develop this condition if: You are male. You have lots of hair near the crease of the buttocks. You are overweight. You have a dimple near the crease of the buttocks. You wear tight clothing. You do not bathe or shower often. You sit for long periods of time. What are the signs or symptoms? Symptoms of this condition may include pain, swelling, redness, and warmth in the pilonidal area. You may also be able to feel a lump near your tailbone if your cyst is big. If your cyst becomes infected, symptoms may include: Pus or fluid drainage. Fever. Pain, swelling, and redness getting worse. The lump getting bigger. How is this diagnosed? This condition may be diagnosed based on: Your symptoms and medical history. A physical exam. A blood test to check for infection. A test of a pus sample. How is this treated? You may not need any treatment if your cyst does not cause symptoms. If your cyst bothers you or is infected, you may need a procedure to drain or remove the cyst. Depending on the size, location, and severity of your cyst, your health care provider may: Make an incision in the cyst and drain it (incision anddrainage). Open and drain the cyst, and then stitch the wound so  that it stays open while it heals (marsupialization). You will be given instructions about how to care for your open wound while it heals. Remove all or part of the cyst, and then close the wound (cyst removal). You may need to take antibiotics before your procedure. Follow these instructions at home: Medicines Take over-the-counter and prescription medicines only as told by your health care provider. If you were prescribed antibiotics, take them as told by your health care provider. Do not stop taking the antibiotic even if you start to feel better. General instructions Keep the area around the cyst clean and dry. If there is fluid or pus draining from your cyst: Cover the area with a clean bandage (dressing). Wash the area gently with soap and water. Pat the area dry with a clean towel. Do not rub the area because that may cause bleeding. Remove hair from the area around the cyst only if your health care provider tells you to. Do not wear tight pants or sit in one position for long periods at a time. Contact a health care provider if: You have new redness, swelling, or pain. You have a fever. You have severe pain. Summary A pilonidal cyst is a fluid-filled sac that forms under the skin near the tailbone, at the top of the crease of the buttocks (pilonidal area). Cysts that become irritated or infected may grow and fill with pus. An infected cyst is called an abscess. The  cause of this condition is not always known. In some cases, it may be caused by a hair that grows into your skin (ingrown hair). You may not need any treatment if your cyst does not cause symptoms. If your cyst bothers you or is infected, you may need a procedure to drain or remove the cyst. This information is not intended to replace advice given to you by your health care provider. Make sure you discuss any questions you have with your health care provider. Document Revised: 10/31/2021 Document Reviewed:  10/31/2021 Elsevier Patient Education  2023 [employer].

## 2023-03-05 ENCOUNTER — Telehealth: Payer: Self-pay | Admitting: Surgery

## 2023-03-05 ENCOUNTER — Encounter: Payer: Self-pay | Admitting: Surgery

## 2023-03-05 ENCOUNTER — Ambulatory Visit: Payer: Commercial Managed Care - PPO | Admitting: Surgery

## 2023-03-05 VITALS — BP 128/79 | HR 81 | Temp 98.0°F | Ht 69.0 in | Wt 218.0 lb

## 2023-03-05 DIAGNOSIS — L0591 Pilonidal cyst without abscess: Secondary | ICD-10-CM | POA: Diagnosis not present

## 2023-03-05 NOTE — Telephone Encounter (Signed)
Patient cancelling surgery for 03/25/23 due to his insurance plan.  When I called on his insurance for coverage was told that he has no surgery coverage at all.  Patient also calls his insurance, he pays a premium of $250.00 a month and only has $450.00 coverage for medical bills a year.  He is very dissatisfied with his insurance company.  Will be looking for other alternatives with insurance and call back when ready.  Patient is wished well.

## 2023-03-05 NOTE — Patient Instructions (Signed)
You have been seen today for a Pilonidal Cyst and we will do surgery for this at [facility] with Dr Lannette Donath will need to arrange to be out of work for approximately 1-2 weeks and change the dressing daily until this heals.    If you have FMLA or Disability paperwork that needs to be filled out, please have your company fax your paperwork to [phone] or you may drop this by either office. This paperwork will be filled out within 3 days after your surgery has been completed.  Please see your (BLUE) Pre-care sheet for more information. Our surgery scheduler will call you to look at surgery dates and to go over surgery information.    Pilonidal Cyst A pilonidal cyst is a fluid-filled sac. It forms beneath the skin near your tailbone, at the top of the crease of your buttocks. A pilonidal cyst that is not large or infected may not cause symptoms or problems. If the cyst becomes irritated or infected, it may fill with pus. This causes pain and swelling (pilonidal abscess). An infected cyst may need to be treated with medicine, drained, or removed. CAUSES The cause of a pilonidal cyst is not known. One cause may be a hair that grows into your skin (ingrown hair). RISK FACTORS Pilonidal cysts are more common in boys and men. Risk factors include: Having lots of hair near the crease of the buttocks. Being overweight. Having a pilonidal dimple. Wearing tight clothing. Not bathing or showering frequently. Sitting for long periods of time. SIGNS AND SYMPTOMS Signs and symptoms of a pilonidal cyst may include: Redness. Pain and tenderness. Warmth. Swelling. Pus. Fever. DIAGNOSIS Your health care provider may diagnose a pilonidal cyst based on your symptoms and a physical exam. The health care provider may do a blood test to check for infection. If your cyst is draining pus, your health care provider may take a sample of the drainage to be tested at a laboratory. TREATMENT Surgery is the usual  treatment for an infected pilonidal cyst. You may also have to take medicines before surgery. The type of surgery you have depends on the size and severity of the infected cyst. The different kinds of surgery include: Incision and drainage. This is a procedure to open and drain the cyst. Marsupialization. In this procedure, a large cyst or abscess may be opened and kept open by stitching the edges of the skin to the cyst walls. Cyst removal. This procedure involves opening the skin and removing all or part of the cyst. HOME CARE INSTRUCTIONS Follow all of your surgeon's instructions carefully if you had surgery. Take medicines only as directed by your health care provider. If you were prescribed an antibiotic medicine, finish it all even if you start to feel better. Keep the area around your pilonidal cyst clean and dry. Clean the area as directed by your health care provider. Pat the area dry with a clean towel. Do not rub it as this may cause bleeding. Remove hair from the area around the cyst as directed by your health care provider. Do not wear tight clothing or sit in one place for long periods of time. There are many different ways to close and cover an incision, including stitches, skin glue, and adhesive strips. Follow your health care provider's instructions on: Incision care. Bandage (dressing) changes and removal. Incision closure removal. SEEK MEDICAL CARE IF:  You have drainage, redness, swelling, or pain at the site of the cyst. You have a fever.  This information is not intended to replace advice given to you by your health care provider. Make sure you discuss any questions you have with your health care provider.   Document Released: 08/02/2000 Document Revised: 08/26/2014 Document Reviewed: 12/23/2013 Elsevier Interactive Patient Education [employer]

## 2023-03-05 NOTE — Progress Notes (Signed)
  03/05/2023  History of Present Illness: Clayton Boyle is a 27 y.o. male presenting for follow up of an infected pilonidal cyst.  He had an I&D of this on 12/09/22 in the office and it has since healed.  He has noticed some mild serous fluid drainage from the sinus pits from time to time, but denies any pain or purulence.  He is ready to schedule surgery to excise the cyst.  Past Medical History: No past medical history on file.   Past Surgical History: Past Surgical History:  Procedure Laterality Date   TONSILLECTOMY      Home Medications: Prior to Admission medications   Not on File    Allergies: Allergies  Allergen Reactions   Penicillins Hives    Review of Systems: Review of Systems  Constitutional:  Negative for chills and fever.  Respiratory:  Negative for shortness of breath.   Cardiovascular:  Negative for chest pain.  Gastrointestinal:  Negative for nausea and vomiting.  Skin:  Negative for rash.    Physical Exam BP 128/79   Pulse 81   Temp 98 F (36.7 C)   Ht 5\' 9"  (1.753 m)   Wt 218 lb (98.9 kg)   SpO2 98%   BMI 32.19 kg/m  CONSTITUTIONAL: No acute distress, well nourished. HEENT:  Normocephalic, atraumatic, extraocular motion intact. RESPIRATORY:  Lungs are clear, and breath sounds are equal bilaterally. Normal respiratory effort without pathologic use of accessory muscles. CARDIOVASCULAR: Heart is regular without murmurs, gallops, or rubs. SKIN: Prior I&D site is well-healed.  The patient has sinus pits at the inferior portion of the gluteal cleft which have very small amount of serous fluid coming from it.  No evidence of infection.  No fluctuance. NEUROLOGIC:  Motor and sensation is grossly normal.  Cranial nerves are grossly intact. PSYCH:  Alert and oriented to person, place and time. Affect is normal.   Assessment and Plan: This is a 27 y.o. male with a previous infected pilonidal cyst, status post I&D.  - The patient's wound is fully healed  and although there is mild fluid from the sinus pits, this is not infected and currently no antibiotics are needed.  Discussed with him that we will otherwise ready for excision.  Discussed with him the surgical plan for excision of pilonidal cyst including the planned incision, the risks of bleeding, infection, injury to surrounding structures, that this would be an outpatient procedure, postoperative activity restrictions, pain control, sutures, and he is willing to proceed. - Will schedule patient for surgery on 03/25/2023.  All of his questions have been answered.  I spent 20 minutes dedicated to the care of this patient on the date of this encounter to include pre-visit review of records, face-to-face time with the patient discussing diagnosis and management, and any post-visit coordination of care.   Howie Ill, MD [facility]

## 2023-03-25 ENCOUNTER — Ambulatory Visit: Admit: 2023-03-25 | Payer: Commercial Managed Care - PPO | Admitting: Surgery

## 2023-03-25 SURGERY — CYST EXCISION PILONIDAL EXTENSIVE
Anesthesia: General
